# Patient Record
Sex: Female | Born: 1970
Health system: Southern US, Community
[De-identification: ages and names within clinical notes are randomized; demographics above are authoritative.]

---

## 2003-09-24 ENCOUNTER — Emergency Department (HOSPITAL_COMMUNITY): Admission: EM | Admit: 2003-09-24 | Discharge: 2003-09-24 | Payer: Self-pay | Admitting: Emergency Medicine

## 2014-06-11 ENCOUNTER — Encounter (HOSPITAL_COMMUNITY): Payer: Self-pay | Admitting: Emergency Medicine

## 2014-06-11 ENCOUNTER — Emergency Department (HOSPITAL_COMMUNITY)
Admission: EM | Admit: 2014-06-11 | Discharge: 2014-06-11 | Disposition: A | Discharge status: Home / Self Care | Payer: Self-pay | Attending: Emergency Medicine | Admitting: Emergency Medicine

## 2014-06-11 DIAGNOSIS — J069 Acute upper respiratory infection, unspecified: Secondary | ICD-10-CM | POA: Insufficient documentation

## 2014-06-11 DIAGNOSIS — Z792 Long term (current) use of antibiotics: Secondary | ICD-10-CM | POA: Insufficient documentation

## 2014-06-11 DIAGNOSIS — H6692 Otitis media, unspecified, left ear: Secondary | ICD-10-CM | POA: Insufficient documentation

## 2014-06-11 DIAGNOSIS — Z720 Tobacco use: Secondary | ICD-10-CM | POA: Insufficient documentation

## 2014-06-11 LAB — RAPID STREP SCREEN (MED CTR MEBANE ONLY): STREPTOCOCCUS, GROUP A SCREEN (DIRECT): NEGATIVE

## 2014-06-11 MED ORDER — AMOXICILLIN 500 MG PO CAPS: 500.0000 mg | ORAL_CAPSULE | Freq: Three times a day (TID) | ORAL | Status: DC

## 2014-06-11 MED ORDER — CARBAMIDE PEROXIDE 6.5 % OT SOLN
5.0000 [drp] | Freq: Two times a day (BID) | OTIC | Status: DC
  Filled 2014-06-11: qty 15

## 2014-06-11 MED ORDER — LIDOCAINE-EPINEPHRINE-TETRACAINE (LET) SOLUTION
3.0000 mL | Freq: Once | NASAL | Status: AC
  Administered 2014-06-11: 0.5 mL via TOPICAL
  Filled 2014-06-11: qty 3

## 2014-06-11 NOTE — ED Notes (Signed)
Strep screen NEGATIVE per Lab

## 2014-06-11 NOTE — ED Notes (Signed)
Pt reports pain and fullness in l/ear since early am. "feels pressure in ear when I burp"

## 2014-06-11 NOTE — Discharge Instructions (Signed)

## 2014-06-11 NOTE — ED Provider Notes (Signed)
CSN: [id number]     Arrival date & time 06/11/14  1242 History  This chart was scribed for non-physician practitioner Marlon Peliffany Rashawd Laskaris, PA-C working with Lyanne CoKevin M Campos, MD by Conchita ParisNadim Abuhashem, ED Scribe. This patient was seen in [facility] and the patient's care was started at 1:53 PM.   Chief Complaint  Patient presents with  . Otalgia    sharp pain in l/era since this am  . Sore Throat    c/o irriated throat with cough   Patient is a 43 y.o. female presenting with ear pain and pharyngitis. The history is provided by the patient. No language interpreter was used.  Otalgia Associated symptoms: sore throat   Sore Throat   HPI Comments: Belinda Casey is a 43 y.o. female who presents to the Emergency Department complaining of left ear pain and a sore throat, onset 1 day ago. She describes the pain as pressure in her left ear.  Pt also complains of generalized left sided body aches, onset 2 days ago but this subsided yesterday. Pt was able to walk. Pt had chills as associated symptoms. She had diarrhea but she believes this was due to her bath. She denies weakness.  She does smoke.  History reviewed. No pertinent past medical history. History reviewed. No pertinent past surgical history. Family History  Problem Relation Age of Onset  . Diabetes Mother   . Hypertension Mother   . Cancer Father    History  Substance Use Topics  . Smoking status: Current Every Day Smoker    Types: Cigarettes  . Smokeless tobacco: Not on file  . Alcohol Use: Yes   OB History    No data available     Review of Systems  HENT: Positive for ear pain and sore throat.   Musculoskeletal: Positive for myalgias.  All other systems reviewed and are negative.  Allergies  Review of patient's allergies indicates no known allergies.  Home Medications   Prior to Admission medications   Medication Sig Start Date End Date Taking? Authorizing Provider  Multiple Vitamin (MULTIVITAMIN WITH MINERALS) TABS  tablet Take 1 tablet by mouth daily.   Yes Historical Provider, MD  amoxicillin (AMOXIL) 500 MG capsule Take 1 capsule (500 mg total) by mouth 3 (three) times daily. 06/11/14   Cameryn Schum Irine SealG Euclid Cassetta, PA-C   BP 112/78 mmHg  Pulse 101  Temp(Src) 98.4 F (36.9 C) (Oral)  Wt 128 lb (58.06 kg)  SpO2 99%  LMP 05/28/2014 (Exact Date) Physical Exam  Constitutional: She is oriented to person, place, and time. She appears well-developed and well-nourished. No distress.  HENT:  Head: Normocephalic and atraumatic.  Right Ear: Tympanic membrane and ear canal normal.  Left Ear: There is swelling. Tympanic membrane is injected and erythematous.  Eyes: EOM are normal.  Neck: Normal range of motion. No spinous process tenderness and no muscular tenderness present.  Cardiovascular: Normal rate.   Pulmonary/Chest: Effort normal and breath sounds normal.  Neurological: She is alert and oriented to person, place, and time.  Skin: Skin is warm and dry.  Psychiatric: She has a normal mood and affect. Her behavior is normal.  Nursing note and vitals reviewed.   ED Course  Procedures  DIAGNOSTIC STUDIES: Oxygen Saturation is 99% on room air, normal by my interpretation.    COORDINATION OF CARE: 2:00 PM Discussed treatment plan with pt at bedside and pt agreed to plan.   Labs Review Labs Reviewed  RAPID STREP SCREEN   Imaging Review No results found.  EKG Interpretation None      MDM   Final diagnoses:  Left otitis media, recurrence not specified, unspecified chronicity, unspecified otitis media type  URI (upper respiratory infection)    Pt is well appearing.  43 y.o.Belinda Casey's evaluation in the Emergency Department is complete. It has been determined that no acute conditions requiring further emergency intervention are present at this time. The patient/guardian have been advised of the diagnosis and plan. We have discussed signs and symptoms that warrant return to the ED, such as  changes or worsening in symptoms.  Vital signs are stable at discharge. Filed Vitals:   06/11/14 1313  BP: 112/78  Pulse: 101  Temp: 98.4 F (36.9 C)    Patient/guardian has voiced understanding and agreed to follow-up with the PCP or specialist.  I personally performed the services described in this documentation, which was scribed in my presence. The recorded information has been reviewed and is accurate.    Dorthula Matasiffany G Raja Caputi, PA-C 06/11/14 1421  Lyanne CoKevin M Campos, MD 06/11/14 [id number]

## 2014-06-13 LAB — CULTURE, GROUP A STREP

## 2015-08-07 ENCOUNTER — Emergency Department (HOSPITAL_COMMUNITY)
Admission: EM | Admit: 2015-08-07 | Discharge: 2015-08-07 | Disposition: A | Discharge status: Home / Self Care | Payer: Self-pay | Attending: Emergency Medicine | Admitting: Emergency Medicine

## 2015-08-07 ENCOUNTER — Encounter (HOSPITAL_COMMUNITY): Payer: Self-pay | Admitting: Emergency Medicine

## 2015-08-07 DIAGNOSIS — Z792 Long term (current) use of antibiotics: Secondary | ICD-10-CM | POA: Insufficient documentation

## 2015-08-07 DIAGNOSIS — K0381 Cracked tooth: Secondary | ICD-10-CM | POA: Insufficient documentation

## 2015-08-07 DIAGNOSIS — K0889 Other specified disorders of teeth and supporting structures: Secondary | ICD-10-CM | POA: Insufficient documentation

## 2015-08-07 DIAGNOSIS — R200 Anesthesia of skin: Secondary | ICD-10-CM | POA: Insufficient documentation

## 2015-08-07 DIAGNOSIS — F17210 Nicotine dependence, cigarettes, uncomplicated: Secondary | ICD-10-CM | POA: Insufficient documentation

## 2015-08-07 DIAGNOSIS — R6883 Chills (without fever): Secondary | ICD-10-CM | POA: Insufficient documentation

## 2015-08-07 DIAGNOSIS — Z79899 Other long term (current) drug therapy: Secondary | ICD-10-CM | POA: Insufficient documentation

## 2015-08-07 MED ORDER — PENICILLIN V POTASSIUM 500 MG PO TABS: 500.0000 mg | ORAL_TABLET | Freq: Four times a day (QID) | ORAL | Status: AC

## 2015-08-07 MED ORDER — HYDROCODONE-ACETAMINOPHEN 5-325 MG PO TABS: 1.0000 | ORAL_TABLET | ORAL | Status: DC | PRN

## 2015-08-07 MED ORDER — IBUPROFEN 800 MG PO TABS: 800.0000 mg | ORAL_TABLET | Freq: Three times a day (TID) | ORAL | Status: DC | PRN

## 2015-08-07 MED ORDER — HYDROCODONE-ACETAMINOPHEN 5-325 MG PO TABS
1.0000 | ORAL_TABLET | Freq: Once | ORAL | Status: AC
  Administered 2015-08-07: 1 via ORAL
  Filled 2015-08-07: qty 1

## 2015-08-07 NOTE — ED Provider Notes (Signed)
CSN: [id number]     Arrival date & time 08/07/15  1809 History  By signing my name below, I, Linna Darner, attest that this documentation has been prepared under the direction and in the presence of non-physician practitioner, Trixie Dredge, PA-C. Electronically Signed: Linna Darner, Scribe. 08/07/2015. 6:36 PM.    Chief Complaint  Patient presents with  . Dental Pain    The history is provided by the patient. No language interpreter was used.     HPI Comments: Belinda Casey is a 45 y.o. female who presents to the Emergency Department complaining of sudden onset, constant, throbbing, right upper tooth pain beginning last night. She states that her tooth began feeling loose around a week ago; she tried to pull it out but it "broke." She reports that the area around the painful tooth feels swollen and numb. She endorses pain exacerbation with palpation to right upper tooth. Feels the area is swollen.  Pt states that she took a generic OTC pain medication with no relief. Pt reports experiencing moderate chills as well. Pt does not have a dentist. She denies fever, body aches, sore throat, difficulty swallowing, difficulty breathing, or any other associated symptoms at this time.    History reviewed. No pertinent past medical history. History reviewed. No pertinent past surgical history. Family History  Problem Relation Age of Onset  . Diabetes Mother   . Hypertension Mother   . Cancer Father    Social History  Substance Use Topics  . Smoking status: Current Some Day Smoker    Types: Cigarettes  . Smokeless tobacco: None  . Alcohol Use: Yes   OB History    No data available     Review of Systems  Constitutional: Negative for fever.  HENT: Positive for dental problem (right upper tooth) and facial swelling. Negative for sore throat and trouble swallowing.        Swelling around area of painful tooth  Respiratory: Negative for shortness of breath.   Musculoskeletal: Negative for  myalgias.  Skin: Negative for color change.  Allergic/Immunologic: Negative for immunocompromised state.  Neurological: Positive for numbness (right upper tooth).      Allergies  Review of patient's allergies indicates no known allergies.  Home Medications   Prior to Admission medications   Medication Sig Start Date End Date Taking? Authorizing Provider  amoxicillin (AMOXIL) 500 MG capsule Take 1 capsule (500 mg total) by mouth 3 (three) times daily. 06/11/14   Marlon Pel, PA-C  Multiple Vitamin (MULTIVITAMIN WITH MINERALS) TABS tablet Take 1 tablet by mouth daily.    Historical Provider, MD   BP 150/89 mmHg  Pulse 101  Temp(Src) 98 F (36.7 C) (Oral)  Resp 18  SpO2 98% Physical Exam  Constitutional: She appears well-developed and well-nourished. No distress.  HENT:  Head: Normocephalic and atraumatic.  Mouth/Throat: Uvula is midline and oropharynx is clear and moist. Mucous membranes are not dry. No uvula swelling. No oropharyngeal exudate, posterior oropharyngeal edema, posterior oropharyngeal erythema or tonsillar abscesses.  Deep fracture of the right upper canine with tenderness to percussion Adjacent facial tenderness.  Slight asymmetry of face with right side appearing slightly larger than left  Neck: Trachea normal, normal range of motion and phonation normal. Neck supple. No tracheal tenderness present. No rigidity. No tracheal deviation, no edema, no erythema and normal range of motion present.  Cardiovascular: Normal rate.   Pulmonary/Chest: Effort normal and breath sounds normal. No stridor.  Lymphadenopathy:    She has no cervical adenopathy.  Neurological: She is alert. She exhibits normal muscle tone.  Skin: She is not diaphoretic.  Nursing note and vitals reviewed.   ED Course  Procedures (including critical care time)  DIAGNOSTIC STUDIES: Oxygen Saturation is 98% on RA, normal by my interpretation.    COORDINATION OF CARE: 6:37 PM Will administer  Hydrocodone. Will discharge with Veetid 500 mg, ibuprofen 800 mg, and Hydrocodone. Discussed treatment plan with pt at bedside and pt agreed to plan.   Labs Review Labs Reviewed - No data to display  Imaging Review No results found. I have personally reviewed and evaluated these images and lab results as part of my medical decision-making.   EKG Interpretation None      MDM   Final diagnoses:  Pain, dental    Afebrile, nontoxic patient with new dental pain and possible abscess. No airway concerns.  Doubt Ludwig's angina.  D/C home with antibiotic, pain medication and dental follow up.  Discussed findings, treatment, and follow up  with patient.  Pt given return precautions.  Pt verbalizes understanding and agrees with plan.       I personally performed the services described in this documentation, which was scribed in my presence. The recorded information has been reviewed and is accurate.      Trixie Dredge, PA-C 08/07/15 1934  Mancel Bale, MD 08/08/15 [phone]

## 2015-08-07 NOTE — Discharge Instructions (Signed)
Read the information below.  Use the prescribed medication as directed.  Please discuss all new medications with your pharmacist.  Do not take additional tylenol while taking the prescribed pain medication to avoid overdose.  You may return to the Emergency Department at any time for worsening condition or any new symptoms that concern you.    Please call the dentist listed above within 48 hours to schedule a close follow up appointment.  If you develop fevers, swelling in your face, difficulty swallowing or breathing, return to the ER immediately for a recheck.     Emergency Department Resource Guide 1) Find a Doctor and Pay Out of Pocket Although you won't have to find out who is covered by your insurance plan, it is a good idea to ask around and get recommendations. You will then need to call the office and see if the doctor you have chosen will accept you as a new patient and what types of options they offer for patients who are self-pay. Some doctors offer discounts or will set up payment plans for their patients who do not have insurance, but you will need to ask so you aren't surprised when you get to your appointment.  2) Contact Your Local Health Department Not all health departments have doctors that can see patients for sick visits, but many do, so it is worth a call to see if yours does. If you don't know where your local health department is, you can check in your phone book. The CDC also has a tool to help you locate your state's health department, and many state websites also have listings of all of their local health departments.  3) Find a Walk-in Clinic If your illness is not likely to be very severe or complicated, you may want to try a walk in clinic. These are popping up all over the country in pharmacies, drugstores, and shopping centers. They're usually staffed by nurse practitioners or physician assistants that have been trained to treat common illnesses and complaints. They're  usually fairly quick and inexpensive. However, if you have serious medical issues or chronic medical problems, these are probably not your best option.  No Primary Care Doctor: - Call Health Connect at  [phone] - they can help you locate a primary care doctor that  accepts your insurance, provides certain services, etc. - Physician Referral Service- [phone]  Chronic Pain Problems: Organization         Address  Phone   Notes  [facility]  [phone] Patients need to be referred by their primary care doctor.   Medication Assistance: Organization         Address  Phone   Notes  [facility] Medication [facility] [address] [place] [place], [place] [address] [phone] --Must be a resident of [facility] -- Must have NO insurance coverage whatsoever (no Medicaid/ Medicare, etc.) -- The pt. MUST have a primary care doctor that directs their care regularly and follows them in the community   [facility]  [phone]   [employer]  [phone]    Agencies that provide inexpensive medical care: Organization         Address  Phone   Notes  [facility]  [phone]   [facility]    [phone]   [facility] [address] [place], [place] [address] [phone]   [facility] [address] [place]  [place] [phone]) [phone]   Planned Parenthood    [phone]   [facility]    [phone]   [facility]  [address], [place] Phone:  [phone], Fax:  [phone]) [phone] Hours of Operation:  9 am - 6 pm, M-F.  Also accepts Medicaid/Medicare and self-pay.  [facility]  [address], [place] Phone: [phone]) [phone], Fax: [phone]. Hours of Operation:  8:30 am - 5:30 pm, M-F.  Also accepts Medicaid and self-pay.  [facility] [place] [address], [place] Phone:  [phone]) [phone]   [facility] [address], [place], [place] [phone], Ext. [phone] Mondays & Thursdays: 7-9 AM.  First 15 patients are seen on a first come, first serve basis.    Medicaid-accepting [facility] Providers:  Organization         Address  Phone   Notes  [facility] [address], [place] [phone]) [phone] Also accepts self-pay patients.  [facility] [address], [place]  [phone]) [phone]   [facility] [address] [place] [address], [address], [place] [phone]) [phone]   [facility] [address] [place] [address], [place] [phone]) [phone]   Renaye RakersVeita Bland [address], [place]   [phone]) [phone] Only accepts [place] Access [place] patients after they have their name applied to their card.   Self-Pay (no insurance) in [facility]:  Organization         Address  Phone   Notes  Sickle Cell Patients, [facility] [address], [place] [phone]   [facility] [address], [place] [phone]   [facility] [place]  [address], [place] [phone]   [facility]/Dr. Osei-Bonsu  [address] [place] [address], [place] or [phone], [place] [phone] Phone number for both [place] and [place] locations is the same.  [facility] [address], [place] [phone]   [facility] [place] [address] [place] [address], [place] or [address] [phone]   [facility] [address], [place] [phone]) [phone], phone; [phone](336) [phone], fax Sees patients 1st and 3rd Saturday of every month.  Must not qualify for public or private insurance (i.e. Medicaid, Medicare, [place] Health Choice, Veterans' Benefits)  Household income should be no more than 200% of the poverty level The clinic cannot treat  you if you are pregnant or think you are pregnant  Sexually transmitted diseases are not treated at the clinic.    Dental Care: Organization         Address  Phone  Notes  [facility] of [facility] [address], [place] [phone] Accepts children up to age 321 who are enrolled in [place] or [place] Health Choice; pregnant women with a Medicaid card; and children who have applied for Medicaid or [place] Health Choice, but were declined, whose parents can pay a reduced fee at time of service.  [facility] [place]  [address], [place] [phone] Accepts children up to age 43 who are enrolled in [place] or [place] Health Choice; pregnant women with a Medicaid card; and children who have applied for Medicaid or [place]  Health Choice, but were declined, whose parents can pay a reduced fee at time of service.  [facility]  [address] [place], [place] [phone] Patients are seen by appointment only. Walk-ins are not accepted. [facility] will see patients 88 years of age and older. Monday - Tuesday (8am-5pm) Most Wednesdays (8:30-5pm) $30 per visit, cash only  [facility] Access [facility]  [address], [facility] [phone] Patients are seen by appointment only. Walk-ins are not accepted. [facility] will see patients 20 years of age and older. One Wednesday Evening (Monthly: Volunteer Based).  $30 per visit, cash only  [employer]  [phone] for adults; Children under age 64, call Graduate Pediatric Dentistry at [phone]. Children aged 45-14, please call [phone] to request a pediatric application.  Dental services are provided in all areas of dental care including fillings, crowns and bridges, complete and partial dentures, implants, gum treatment, root canals, and extractions. Preventive care is also provided. Treatment  is provided to both adults and children. Patients are selected via a lottery and there is often a waiting list.   [facility] [address] [place]  [phone] [email or web address]   [facility] [address] [place], [place] [phone], Ext. [phone] Second and Fourth Thursday of each month, opens at 6:30 AM; Clinic ends at 9 AM.  Patients are seen on a first-come first-served basis, and a limited number are seen during each clinic.   [facility]  [address] [place], [place] [phone]   Eligibility Requirements You must have lived in [place], [place], or [place] counties for at least the last three months.   You cannot be eligible for state or federal sponsored [place], including [employer], [place], or [employer].   You generally cannot be eligible for healthcare insurance through your employer.    How to apply: Eligibility screenings are held every Tuesday and Wednesday afternoon from 1:00 pm until 4:00 pm. You do not need an appointment for the interview!  [facility] [address], [place], [place] [phone]   [facility] Health Department  [phone]   [facility] Health Department  [phone]   [facility] Health Department  [phone]    Behavioral Health Resources in the Community: Intensive Outpatient Programs Organization         Address  Phone  Notes  [facility] [address], [place], [place] [phone]   [facility]patient [address], [place], [place] [phone]   [facility] [address] [place], [place]  [phone]   [facility] Mental Health [address],  [place], [place] [phone] or [phone]   Substance Abuse Resources Organization         Address  Phone  Notes  Al[facility] Drug Services  [phone]   [facility]  [phone]   The  [place]  [phone]   Floydene Flock  [phone]   Residential & Outpatient Substance Abuse Program  [phone]   Psychological Services Organization         Address  Phone  Notes  [facility] Behavioral Health  [phone]   [facility]  [phone]   [facility] [address] [place] [address], [place] [phone] or [phone]    Mobile Crisis Teams Organization         Address  Phone  Notes  [facility], Mobile Crisis Care Unit  [phone]   [facility]  [address] [place], [place] [phone]   [facility] [address] [place] [phone]    Self-Help/Support Groups Organization         Address  Phone             Notes  [facility]ental Health Assoc. of [place] - variety of support groups  336- [phone] Call for more information  Narcotics Anonymous (NA), C[facility] [address], [place]  2 meetings at this location   Statisticianesidential Treatment Programs Organization         Address  Phone  Notes  [facility] [address],    [place]  [phone]   [facility]  [address], [place] [phone], [place], [place] [phone]   [facility] [address], [place] [phone] Admissions: 8am-3pm M-F  [facility] [address]    [place], [place] [phone]   [facility] [address], [place], [place] [phone]   [facility] [address]  [place], [place] [phone]   [facility] - Intensive Outpatient [address], [place], [place] [phone]   [facility] ([facility] [address]  [place], [place] [phone] or [phone]   [facility] ([facility]) [address] [place], [place] [phone] Accepts Medicaid  [facility] [address]  [place] [phone] Substance Abuse/Addiction Treatment     [facility] Behavioral Health Resources Organization         Address  Phone  Notes  [facility]  [phone](888) [phone]   Angie FavaJulie Brannon, PhD [address], [address] [place], [place]   [phone]) [phone] or [phone](336) [phone]   [facility]   [address] [place], [place] [phone](336) [phone]   [facility] [address], [place], [place] [phone]) [phone] Insurance/Medicaid/sponsorship through [facility]  [facility]ith [facility] Families [address]                                    [place], [place] [phone]) [phone] Therapy/tele-psych/case  [facility] [address]   [place], [place] [phone]) [phone]    Dr. Lolly MustacheArfeen  [phone](336) [phone]   [facility] [place]  [employer] [facility] 1) [address], [place] 2) [address], [place] 3)  [address], [place] [phone]) [phone]  [phone](336) [phone]   [facility] Child Abuse Hotline [phone](336) [phone] or [phone](336) [phone] (After Hours)

## 2015-08-07 NOTE — ED Notes (Signed)
Pt from home for eval of dental pain to left upper tooth, states felt lose so pt tried to pull it but tooth broke. States does not have a dentist. No swelling or reddness ntoed. Pt tearful in triage.

## 2018-07-01 ENCOUNTER — Other Ambulatory Visit: Payer: Self-pay | Admitting: Family Medicine

## 2018-07-01 ENCOUNTER — Other Ambulatory Visit (HOSPITAL_COMMUNITY)
Admission: RE | Admit: 2018-07-01 | Discharge: 2018-07-01 | Disposition: A | Discharge status: Home / Self Care | Payer: 59 | Source: Ambulatory Visit | Attending: Family Medicine | Admitting: Family Medicine

## 2018-07-01 DIAGNOSIS — Z124 Encounter for screening for malignant neoplasm of cervix: Secondary | ICD-10-CM | POA: Diagnosis not present

## 2018-07-01 DIAGNOSIS — Z0000 Encounter for general adult medical examination without abnormal findings: Secondary | ICD-10-CM | POA: Diagnosis not present

## 2018-07-01 DIAGNOSIS — Z13220 Encounter for screening for lipoid disorders: Secondary | ICD-10-CM | POA: Diagnosis not present

## 2018-07-01 DIAGNOSIS — Z23 Encounter for immunization: Secondary | ICD-10-CM | POA: Diagnosis not present

## 2018-07-03 LAB — CYTOLOGY - PAP: Diagnosis: NEGATIVE

## 2019-01-17 ENCOUNTER — Telehealth: Payer: Self-pay

## 2019-01-17 DIAGNOSIS — Z20822 Contact with and (suspected) exposure to covid-19: Secondary | ICD-10-CM

## 2019-01-17 NOTE — Telephone Encounter (Signed)
Pt called wanting screening for covid. Pt given [place] location and order placed.

## 2019-01-18 ENCOUNTER — Other Ambulatory Visit: Payer: Self-pay

## 2019-01-18 DIAGNOSIS — Z20822 Contact with and (suspected) exposure to covid-19: Secondary | ICD-10-CM

## 2019-01-19 LAB — NOVEL CORONAVIRUS, NAA: SARS-CoV-2, NAA: NOT DETECTED

## 2019-07-23 ENCOUNTER — Ambulatory Visit (HOSPITAL_COMMUNITY)
Admission: EM | Admit: 2019-07-23 | Discharge: 2019-07-23 | Disposition: A | Discharge status: Home / Self Care | Payer: 59 | Attending: Family Medicine | Admitting: Family Medicine

## 2019-07-23 ENCOUNTER — Encounter (HOSPITAL_COMMUNITY): Payer: Self-pay | Admitting: Emergency Medicine

## 2019-07-23 ENCOUNTER — Other Ambulatory Visit: Payer: Self-pay

## 2019-07-23 DIAGNOSIS — S0003XA Contusion of scalp, initial encounter: Secondary | ICD-10-CM | POA: Diagnosis not present

## 2019-07-23 DIAGNOSIS — S0990XA Unspecified injury of head, initial encounter: Secondary | ICD-10-CM

## 2019-07-23 NOTE — Discharge Instructions (Addendum)
Take ibuprofen 600 - 800 mg for headache Take with food Rest Return as needed

## 2019-07-23 NOTE — ED Provider Notes (Signed)
[facility]    CSN: [id number] Arrival date & time: 07/23/19  1336      History   Chief Complaint Chief Complaint  Patient presents with  . Head Injury    HPI Belinda Casey is a 49 y.o. female.   HPI  Patient states that she was reaching up at the maximum overhead reach and pulled on a rack for hanging clothing.  A rack that was stacked on top of hers fell forward and struck her across the top of her head.  It knocked her to the ground.  She had immediate pain.  No loss of consciousness.  Injury was witnessed by coworker.  Since then she has had headache, photophobia, feeling tired. No change in thought or behavior.  No change in balance or dexterity.  No nausea or vomiting.  History reviewed. No pertinent past medical history.  There are no problems to display for this patient.   History reviewed. No pertinent surgical history.  OB History   No obstetric history on file.      Home Medications    Prior to Admission medications   Medication Sig Start Date End Date Taking? Authorizing Provider  ibuprofen (ADVIL,MOTRIN) 800 MG tablet Take 1 tablet (800 mg total) by mouth every 8 (eight) hours as needed for mild pain or moderate pain. 08/07/15   Trixie Dredge, PA-C  Multiple Vitamin (MULTIVITAMIN WITH MINERALS) TABS tablet Take 1 tablet by mouth daily.    [provider]    Family History Family History  Problem Relation Age of Onset  . Cancer Father   . Diabetes Mother   . Hypertension Mother     Social History Social History   Tobacco Use  . Smoking status: Current Some Day Smoker    Types: Cigarettes  Substance Use Topics  . Alcohol use: Yes  . Drug use: Not on file     Allergies   Patient has no known allergies.   Review of Systems Review of Systems  Constitutional: Positive for fatigue.  Eyes: Positive for photophobia. Negative for visual disturbance.  Musculoskeletal: Negative for gait problem.  Neurological: Positive for  headaches. Negative for weakness and numbness.  Psychiatric/Behavioral: Negative for behavioral problems, confusion and decreased concentration.     Physical Exam Triage Vital Signs ED Triage Vitals  Enc Vitals Group     BP 07/23/19 1449 (!) 140/104     Pulse Rate 07/23/19 1449 90     Resp 07/23/19 1449 18     Temp 07/23/19 1449 98.7 F (37.1 C)     Temp src --      SpO2 07/23/19 1449 99 %     Weight --      Height --      Head Circumference --      Peak Flow --      Pain Score 07/23/19 1451 5     Pain Loc --      Pain Edu? --      Excl. in GC? --    No data found.  Updated Vital Signs BP (!) 140/104   Pulse 90   Temp 98.7 F (37.1 C)   Resp 18   LMP 07/07/2019   SpO2 99%   Visual Acuity Right Eye Distance:   Left Eye Distance:   Bilateral Distance:    Right Eye Near:   Left Eye Near:    Bilateral Near:     Physical Exam Constitutional:      General: She is  not in acute distress.    Appearance: Normal appearance. She is well-developed.  HENT:     Head: Normocephalic and atraumatic.     Nose: Nose normal.     Mouth/Throat:     Mouth: Mucous membranes are moist.     Pharynx: No posterior oropharyngeal erythema.  Eyes:     Extraocular Movements: Extraocular movements intact.     Conjunctiva/sclera: Conjunctivae normal.     Pupils: Pupils are equal, round, and reactive to light.     Comments: Pupils equal, discs flat  Cardiovascular:     Rate and Rhythm: Normal rate.  Pulmonary:     Effort: Pulmonary effort is normal. No respiratory distress.  Musculoskeletal:        General: Normal range of motion.     Cervical back: Normal range of motion and neck supple. No tenderness.  Lymphadenopathy:     Cervical: No cervical adenopathy.  Skin:    General: Skin is warm and dry.     Comments: See picture.  No palpable defect  Neurological:     General: No focal deficit present.     Mental Status: She is alert.     Motor: No weakness.     Coordination:  Coordination normal.     Gait: Gait normal.     Deep Tendon Reflexes: Reflexes normal.  Psychiatric:        Mood and Affect: Mood normal.        Behavior: Behavior normal.        UC Treatments / Results  Labs (all labs ordered are listed, but only abnormal results are displayed) Labs Reviewed - No data to display  EKG   Radiology No results found.  Procedures Procedures (including critical care time)  Medications Ordered in UC Medications - No data to display  Initial Impression / Assessment and Plan / UC Course  I have reviewed the triage vital signs and the nursing notes.  Pertinent labs & imaging results that were available during my care of the patient were reviewed by me and considered in my medical decision making (see chart for details).     Head contusion with mild concussion Final Clinical Impressions(s) / UC Diagnoses   Final diagnoses:  Contusion of scalp, initial encounter  Closed head injury, initial encounter     Discharge Instructions     Take ibuprofen 600 - 800 mg for headache Take with food Rest Return as needed   ED Prescriptions    None     PDMP not reviewed this encounter.   Raylene Everts, MD 07/23/19 [phone]

## 2019-07-23 NOTE — ED Triage Notes (Signed)
Pt states today a metal fixture fell from the ceiling, around 5-7 pounds, hit her right on the top of the head. Denies LOC. Mild headache.

## 2020-02-11 ENCOUNTER — Encounter (HOSPITAL_COMMUNITY): Payer: Self-pay

## 2020-02-11 ENCOUNTER — Ambulatory Visit (HOSPITAL_COMMUNITY)
Admission: EM | Admit: 2020-02-11 | Discharge: 2020-02-11 | Disposition: A | Discharge status: Home / Self Care | Payer: 59 | Attending: Physician Assistant | Admitting: Physician Assistant

## 2020-02-11 ENCOUNTER — Other Ambulatory Visit: Payer: Self-pay

## 2020-02-11 DIAGNOSIS — M5431 Sciatica, right side: Secondary | ICD-10-CM | POA: Diagnosis not present

## 2020-02-11 MED ORDER — KETOROLAC TROMETHAMINE 30 MG/ML IJ SOLN
30.0000 mg | Freq: Once | INTRAMUSCULAR | Status: AC
  Administered 2020-02-11: 30 mg via INTRAMUSCULAR

## 2020-02-11 MED ORDER — ACETAMINOPHEN 500 MG PO TABS: 1000.0000 mg | ORAL_TABLET | Freq: Three times a day (TID) | ORAL | 0 refills | Status: DC | PRN

## 2020-02-11 MED ORDER — KETOROLAC TROMETHAMINE 30 MG/ML IJ SOLN
INTRAMUSCULAR | Status: AC
  Filled 2020-02-11: qty 1

## 2020-02-11 MED ORDER — NAPROXEN 500 MG PO TABS: 500.0000 mg | ORAL_TABLET | Freq: Two times a day (BID) | ORAL | 0 refills | Status: DC

## 2020-02-11 MED ORDER — TIZANIDINE HCL 4 MG PO TABS: 4.0000 mg | ORAL_TABLET | Freq: Every day | ORAL | 0 refills | Status: AC

## 2020-02-11 NOTE — Discharge Instructions (Signed)
Take medication as prescribed - naproxen 2 times a day with food.  - tylenol every 8 hours - muscle relaxer only at night, this will make you sleepy, do not drive, drink alcohol or operate machinery within 8 hours   Follow up with sports medicine if not improving over next few days

## 2020-02-11 NOTE — ED Triage Notes (Signed)
Pt present right leg and hip pain, symptoms started on Thursday. Pt is having difficulty walking and if she sits to long it gets stuff and worst.

## 2020-02-11 NOTE — ED Provider Notes (Signed)
[facility]    CSN: [id number] Arrival date & time: 02/11/20  1737      History   Chief Complaint Chief Complaint  Patient presents with  . Leg Pain    right     HPI Belinda Casey is a 49 y.o. female.   Patient reports for evaluation of right hip and leg pain.  She reports symptoms started a few days ago.  She reports pain starting in the right hip radiating down her right leg.  She reports an aching and a shooting pain.  She reports pain is worse after being seated for a while and gets very stiff.  Denies any weakness or numbness.  Denies any change in bowels or bladder.  She does report she had a little bit of low back pain before the symptoms started.  Denies any trauma, or lifting heavy objects.  No previous injuries.  No previous issues.  No rashes.     History reviewed. No pertinent past medical history.  There are no problems to display for this patient.   History reviewed. No pertinent surgical history.  OB History   No obstetric history on file.      Home Medications    Prior to Admission medications   Medication Sig Start Date End Date Taking? Authorizing Provider  acetaminophen (TYLENOL) 500 MG tablet Take 2 tablets (1,000 mg total) by mouth every 8 (eight) hours as needed. 02/11/20   Yarisbel Miranda, Veryl Speak, PA-C  ibuprofen (ADVIL,MOTRIN) 800 MG tablet Take 1 tablet (800 mg total) by mouth every 8 (eight) hours as needed for mild pain or moderate pain. 08/07/15   Trixie Dredge, PA-C  Multiple Vitamin (MULTIVITAMIN WITH MINERALS) TABS tablet Take 1 tablet by mouth daily.    [provider]  naproxen (NAPROSYN) 500 MG tablet Take 1 tablet (500 mg total) by mouth 2 (two) times daily. 02/11/20   Karlis Cregg, Veryl Speak, PA-C  tiZANidine (ZANAFLEX) 4 MG tablet Take 1 tablet (4 mg total) by mouth at bedtime for 14 days. 02/11/20 02/25/20  Tyja Gortney, Veryl Speak, PA-C    Family History Family History  Problem Relation Age of Onset  . Cancer Father   . Diabetes Mother   .  Hypertension Mother     Social History Social History   Tobacco Use  . Smoking status: Current Some Day Smoker    Types: Cigarettes  Substance Use Topics  . Alcohol use: Yes  . Drug use: Not on file     Allergies   Patient has no known allergies.   Review of Systems Review of Systems   Physical Exam Triage Vital Signs ED Triage Vitals  Enc Vitals Group     BP 02/11/20 1914 (!) 154/104     Pulse Rate 02/11/20 1914 81     Resp 02/11/20 1914 18     Temp 02/11/20 1914 98.2 F (36.8 C)     Temp Source 02/11/20 1914 Oral     SpO2 02/11/20 1914 100 %     Weight --      Height --      Head Circumference --      Peak Flow --      Pain Score 02/11/20 1915 10     Pain Loc --      Pain Edu? --      Excl. in GC? --    No data found.  Updated Vital Signs BP (!) 154/104 (BP Location: Right Arm)   Pulse 81   Temp  98.2 F (36.8 C) (Oral)   Resp 18   SpO2 100%   Visual Acuity Right Eye Distance:   Left Eye Distance:   Bilateral Distance:    Right Eye Near:   Left Eye Near:    Bilateral Near:     Physical Exam Vitals and nursing note reviewed.  Constitutional:      General: She is not in acute distress.    Appearance: She is well-developed. She is not ill-appearing.  HENT:     Head: Normocephalic and atraumatic.  Cardiovascular:     Rate and Rhythm: Normal rate.     Heart sounds: No murmur heard.   Pulmonary:     Effort: Pulmonary effort is normal. No respiratory distress.  Musculoskeletal:     Comments: Mild tenderness to palpation of the right lower lumbar, with extension of tenderness into the right upper gluteal musculature through the lateral glutes.  There is some tenderness to palpation over the greater trochanter.  Minimal tenderness of the right thigh musculature or hamstrings musculature.  Patient does have some pain with terminal hip flexion, no significant pain with FABER or FADIR.  Straight leg raise is positive.  She is ambulatory.  Strength is  5/5 in lower extremities equal bilaterally.  Sensation is intact.  Distal pulses 2+.  Skin:    General: Skin is warm and dry.  Neurological:     Mental Status: She is alert.      UC Treatments / Results  Labs (all labs ordered are listed, but only abnormal results are displayed) Labs Reviewed - No data to display  EKG   Radiology No results found.  Procedures Procedures (including critical care time)  Medications Ordered in UC Medications  ketorolac (TORADOL) 30 MG/ML injection 30 mg (30 mg Intramuscular Given 02/11/20 2009)    Initial Impression / Assessment and Plan / UC Course  I have reviewed the triage vital signs and the nursing notes.  Pertinent labs & imaging results that were available during my care of the patient were reviewed by me and considered in my medical decision making (see chart for details).     #Sciatica right side Patient is a 49 year old presenting with appears to be sciatica of the right side.  No red flags.  No indication for imaging.  Will give Toradol injection here and naproxen outpatient.  Muscle relaxer prescribed.  We will have her follow-up with sports medicine.  Patient verbalized understanding plan of care. Final Clinical Impressions(s) / UC Diagnoses   Final diagnoses:  Sciatica of right side     Discharge Instructions     Take medication as prescribed - naproxen 2 times a day with food.  - tylenol every 8 hours - muscle relaxer only at night, this will make you sleepy, do not drive, drink alcohol or operate machinery within 8 hours   Follow up with sports medicine if not improving over next few days      ED Prescriptions    Medication Sig Dispense Auth. Provider   naproxen (NAPROSYN) 500 MG tablet Take 1 tablet (500 mg total) by mouth 2 (two) times daily. 30 tablet Amdrew Oboyle, Veryl Speak, PA-C   tiZANidine (ZANAFLEX) 4 MG tablet Take 1 tablet (4 mg total) by mouth at bedtime for 14 days. 14 tablet Arlisa Leclere, Veryl Speak, PA-C    acetaminophen (TYLENOL) 500 MG tablet Take 2 tablets (1,000 mg total) by mouth every 8 (eight) hours as needed. 30 tablet Paizley Ramella, Veryl Speak, PA-C     PDMP  not reviewed this encounter.   Hermelinda Medicus, PA-C 02/11/20 2122

## 2020-02-18 ENCOUNTER — Ambulatory Visit
Admission: RE | Admit: 2020-02-18 | Discharge: 2020-02-18 | Disposition: A | Discharge status: Home / Self Care | Payer: 59 | Source: Ambulatory Visit | Attending: Family Medicine | Admitting: Family Medicine

## 2020-02-18 ENCOUNTER — Ambulatory Visit (INDEPENDENT_AMBULATORY_CARE_PROVIDER_SITE_OTHER): Payer: 59 | Admitting: Family Medicine

## 2020-02-18 ENCOUNTER — Other Ambulatory Visit: Payer: Self-pay

## 2020-02-18 VITALS — BP 122/82 | Ht 61.0 in

## 2020-02-18 DIAGNOSIS — M5441 Lumbago with sciatica, right side: Secondary | ICD-10-CM

## 2020-02-18 DIAGNOSIS — R29898 Other symptoms and signs involving the musculoskeletal system: Secondary | ICD-10-CM

## 2020-02-18 MED ORDER — GABAPENTIN 100 MG PO CAPS: ORAL_CAPSULE | ORAL | 0 refills | Status: DC

## 2020-02-18 MED ORDER — PREDNISONE 10 MG PO TABS: ORAL_TABLET | ORAL | 0 refills | Status: DC

## 2020-02-18 NOTE — Progress Notes (Signed)
  Belinda Casey - 49 y.o. female MRN [id number]  Date of birth: [birth year]    SUBJECTIVE:      Chief Complaint:/ HPI:   1 week of right lower extremity wekaness. Started acutely, noted when sho got out of bed about 7 days ago. Initially had some low back pain but that has improved. Right leg feels like it will not hold her p. Feels numb in anterior thigh area and having shooting pains down inside and into groin. Having d3fficulty at work as she stands most of the day and lifts boxes. No prior issues with back No back surgery No systemic symptoms. Pain is improved in back but weakness in leg seems to be gtting worse.    OBJECTIVE: BP 122/82   Ht 5\' 1"  (1.549 m)   BMI 24.19 kg/m   Physical Exam:  Vital signs are reviewed. GEN WD WN NAD BACK nontender, no spasm, no defect HIPS IR/ER full and painless MSK: LE strength normal on left side but on  Right, hip extension, knee extension and flexion, dorsiflexion and plantar flexion all 4/5.  NEURO: DTRs 2+ bilaterally equal knees , 1+ B ankle. Decreased sensation to soft touch anterior right thigh, EXT: No lower extremity edema VASC: DP 2+ B= Gait is antalgic   ASSESSMENT & PLAN:  1. Concern for progressive myelopathy right lower extreimty. Most likely from HNP although back pain is minimal. Cannot rule out CNS disease. Will start with imaging of LS spine. Work note. She wants to try and go back in a few days, at least part time, as finances are tight.Does not think she needs any pain medicine, but would be willing to try some  Gabapentin to help lancinating intermittent pains which bother her when standing. Discussed red flags. F/u next week

## 2020-02-18 NOTE — Patient Instructions (Signed)
Am starting you on prednisone as an anti-inflammatory.  Take 5 tablets a day for the next 5 days.  Take with food.  I am also starting you on a medicine called gabapentin.  We will start low and go slowly with a taper up.  Start 1 tablet at night for 3 days and then increase to 2 tablets at night.  I will set you up with follow-up next week.  We will write you out of work until follow-up next week.  I suspect you have a ruptured disc in your back.  You have significant weakness of your right lower extremity.  If this should worsen or if you have any changes in urinary or bowel continence, you need to let us know or go to the emergency room as soon as possible.  Please call us with any new or worsening symptoms.

## 2020-02-19 ENCOUNTER — Encounter: Payer: Self-pay | Admitting: Family Medicine

## 2020-02-23 ENCOUNTER — Ambulatory Visit (INDEPENDENT_AMBULATORY_CARE_PROVIDER_SITE_OTHER): Payer: 59 | Admitting: Family Medicine

## 2020-02-23 ENCOUNTER — Other Ambulatory Visit: Payer: Self-pay

## 2020-02-23 ENCOUNTER — Encounter: Payer: Self-pay | Admitting: Family Medicine

## 2020-02-23 VITALS — BP 114/82 | Ht 61.0 in

## 2020-02-23 DIAGNOSIS — M5416 Radiculopathy, lumbar region: Secondary | ICD-10-CM

## 2020-02-23 MED ORDER — HYDROCODONE-ACETAMINOPHEN 5-325 MG PO TABS: 1.0000 | ORAL_TABLET | Freq: Four times a day (QID) | ORAL | 0 refills | Status: DC | PRN

## 2020-02-23 MED ORDER — CYCLOBENZAPRINE HCL 10 MG PO TABS: 10.0000 mg | ORAL_TABLET | Freq: Every evening | ORAL | 0 refills | Status: DC | PRN

## 2020-02-23 MED ORDER — MELOXICAM 15 MG PO TABS: 15.0000 mg | ORAL_TABLET | Freq: Every day | ORAL | 2 refills | Status: DC

## 2020-02-23 NOTE — Progress Notes (Addendum)
   PCP: System, Pcp Not In  Subjective:   HPI: Patient is a 49 y.o. female here for follow-up on lumbar radiculopathy.  She was seen on 02/18/2020 by Dr. Jennette Kettle, at that time was having acute lower extremity pain and weakness.  It started when she got out of bed, she not have any trauma or significant inciting factors prior to the onset of her pain.  She does however stand for most of the day and lift boxes in her job at [employer].  She denies any prior back pain, surgery.  No fevers or chills.  At last visit, patient was prescribed a prednisone burst and gabapentin 100 mg at bedtime for the pain.  X-rays were obtained which showed L4-L5 lumbar spondylosis which is consistent with patient's distribution of pain.  MRI was ordered which is pending.  Since last visit, patient reports that the prednisone seem helpful for 1 day but did not help beyond that.  She reports that she feels basically the same as she did at previous visit.  She feels the gabapentin is somewhat helpful.  She has not been able to complete her activities at work and is wondering if there is any other medication she can take in the meantime to help with her pain.  She continues to deny any saddle anesthesia, significant lower extremity weakness, bowel/bladder incontinence, or any other cauda equina syndrome symptoms.  Review of Systems:  Per HPI.   PMFSH, medications and smoking status reviewed.      Objective:  Physical Exam:  Gen: awake, alert, NAD, comfortable in exam room Pulm: breathing unlabored  Lumbar spine:  Inspection: No evidence of erythema, ecchymosis, swelling edema.  Palpation: No midline spinal tenderness. Nontender to facets.  Mild right paraspinal tenderness. Nontender to SI joints.  ROM: Intact to forward flexion, extension, rotation, and bending.  She does have some pain with forward flexion. Special tests: Positive straight leg raise on the right with reproduction of symptoms on the right leg.  Negative on  left. Neurovascularly intact distally.    Assessment & Plan:  1.  L4-5 lumbar radiculopathy  Patient with continued significant neuropathic symptoms and radiculopathy consistent with x-ray findings.  MRI has been ordered by Dr. Jennette Kettle, plan to follow this up for determination of next steps.  We discussed the possibility of spinal injections versus PT versus possibly surgery depending on what the results show.  In the meantime, we will start meloxicam daily, increase her gabapentin to 200 mg at night and 100 mg in the morning, and give a small course of Norco for breakthrough pain.  She knows to follow-up if she develops worsening symptoms or any cauda equina symptoms.   Guy Sandifer, MD [facility] Fellow 02/23/2020 2:38 PM  Addendum:  Patient seen in the office by fellow.  His history, exam, plan of care were precepted with me.  Norton Blizzard MD Marrianne Mood

## 2020-03-06 ENCOUNTER — Telehealth: Payer: Self-pay | Admitting: Family Medicine

## 2020-03-06 NOTE — Telephone Encounter (Signed)
Patient is calling stating she recently got her MRI and is needing Dr. Jennette Kettle to call her with results and then next step that's being taking, patient says at work she is only able to work 4 to 5 hours with being and horrible pain. Thanks

## 2020-03-07 ENCOUNTER — Encounter: Payer: Self-pay | Admitting: *Deleted

## 2020-03-07 NOTE — Telephone Encounter (Signed)
I found it through Care Everywhere. She needs a back Careers adviser. Please see who she wants to be scheduled with. I can give her a note out of work if she wants and I can give her some pain meds until she sees Careers adviser. Denny Levy

## 2020-03-07 NOTE — Telephone Encounter (Signed)
Can you get her MRI results for me? THANKS! Denny Levy

## 2020-03-13 ENCOUNTER — Encounter: Payer: Self-pay | Admitting: *Deleted

## 2020-03-13 NOTE — Progress Notes (Signed)
Dr Coletta Memos [facility] [address] [place] [address] 03/14/20 @ 1p [phone]

## 2021-05-03 ENCOUNTER — Emergency Department (HOSPITAL_COMMUNITY)
Admission: EM | Admit: 2021-05-03 | Discharge: 2021-05-03 | Discharge status: Against Medical Advice | Payer: 59 | Attending: Emergency Medicine | Admitting: Emergency Medicine

## 2021-05-03 NOTE — ED Notes (Signed)
T didn't answer when called for triage

## 2021-05-03 NOTE — ED Provider Notes (Cosign Needed)
Pt eloped prior to being triaged. I did not establish care with her.   Karrie Meres, [place] 05/03/21 2057

## 2021-05-04 ENCOUNTER — Encounter (HOSPITAL_COMMUNITY): Payer: Self-pay

## 2021-05-04 ENCOUNTER — Other Ambulatory Visit: Payer: Self-pay

## 2021-05-04 ENCOUNTER — Ambulatory Visit (HOSPITAL_COMMUNITY)
Admission: EM | Admit: 2021-05-04 | Discharge: 2021-05-04 | Disposition: A | Discharge status: Home / Self Care | Payer: 59

## 2021-05-04 DIAGNOSIS — J069 Acute upper respiratory infection, unspecified: Secondary | ICD-10-CM | POA: Diagnosis not present

## 2021-05-04 DIAGNOSIS — R0981 Nasal congestion: Secondary | ICD-10-CM

## 2021-05-04 MED ORDER — BENZONATATE 100 MG PO CAPS: 100.0000 mg | ORAL_CAPSULE | Freq: Three times a day (TID) | ORAL | 0 refills | Status: DC

## 2021-05-04 MED ORDER — ONDANSETRON 4 MG PO TBDP: 4.0000 mg | ORAL_TABLET | Freq: Three times a day (TID) | ORAL | 0 refills | Status: DC | PRN

## 2021-05-04 MED ORDER — FLUTICASONE PROPIONATE 50 MCG/ACT NA SUSP: 2.0000 | Freq: Every day | NASAL | 0 refills | Status: DC

## 2021-05-04 NOTE — ED Triage Notes (Signed)
Pt c/o rt ear pain since Monday, currently on antibiotics for ear infection. States now having diarrhea, vomiting, and dizziness since starting the antibiotics. Pt states still having cough and congestion.

## 2021-05-04 NOTE — ED Provider Notes (Addendum)
[facility]    CSN: [id number] Arrival date & time: 05/04/21  5852      History   Chief Complaint Chief Complaint  Patient presents with   Otalgia    HPI Belinda Casey is a 50 y.o. female.   HPI  Otalgia: Pt reports that she has had right ear pain since Monday. She also reports cough and congestion. She was started on Augmentin for an ear infection but reports that this medication has caused her diarrhea, vomiting and some occasional dizziness. NO abdominal pain, dysuria. She has completed 5 days of this medication. She has been able to rest, hydrate and keep down fluids/food. No bloody movements or vomiting.   History reviewed. No pertinent past medical history.  There are no problems to display for this patient.   History reviewed. No pertinent surgical history.  OB History   No obstetric history on file.      Home Medications    Prior to Admission medications   Medication Sig Start Date End Date Taking? Authorizing Provider  benzonatate (TESSALON) 100 MG capsule Take 1 capsule (100 mg total) by mouth every 8 (eight) hours. 05/04/21  Yes Kalany Diekmann M, PA-C  fluticasone [facility]) 50 MCG/ACT nasal spray Place 2 sprays into both nostrils daily. 05/04/21  Yes Zara Wendt, Maralyn Sago M, PA-C  ondansetron (ZOFRAN ODT) 4 MG disintegrating tablet Take 1 tablet (4 mg total) by mouth every 8 (eight) hours as needed for nausea or vomiting. 05/04/21  Yes Deshunda Thackston M, PA-C  ibuprofen (ADVIL,MOTRIN) 800 MG tablet Take 1 tablet (800 mg total) by mouth every 8 (eight) hours as needed for mild pain or moderate pain. 08/07/15   Trixie Dredge, PA-C  Multiple Vitamin (MULTIVITAMIN WITH MINERALS) TABS tablet Take 1 tablet by mouth daily.    [provider]    Family History Family History  Problem Relation Age of Onset   Cancer Father    Diabetes Mother    Hypertension Mother     Social History Social History   Tobacco Use   Smoking status: Some Days     Types: Cigarettes  Substance Use Topics   Alcohol use: Yes     Allergies   Patient has no known allergies.   Review of Systems Review of Systems  As stated above in HPI Physical Exam Triage Vital Signs ED Triage Vitals  Enc Vitals Group     BP 05/04/21 0856 (!) 147/115     Pulse Rate 05/04/21 0856 (!) 101     Resp 05/04/21 0856 18     Temp 05/04/21 0856 99.3 F (37.4 C)     Temp Source 05/04/21 0856 Oral     SpO2 05/04/21 0856 98 %     Weight --      Height --      Head Circumference --      Peak Flow --      Pain Score 05/04/21 0857 6     Pain Loc --      Pain Edu? --      Excl. in GC? --    No data found.  Updated Vital Signs BP (!) 147/115 (BP Location: Left Arm)   Pulse (!) 101   Temp 99.3 F (37.4 C) (Oral)   Resp 18   LMP 04/27/2021   SpO2 98%   Physical Exam Vitals and nursing note reviewed.  Constitutional:      General: She is not in acute distress.    Appearance: Normal appearance. She  is not ill-appearing, toxic-appearing or diaphoretic.  HENT:     Head: Normocephalic and atraumatic.     Right Ear: Tympanic membrane normal.     Left Ear: Tympanic membrane normal.     Ears:     Comments: Clear middle ear effusion bilaterally     Nose: Congestion (No sinus bogginess) and rhinorrhea present.     Mouth/Throat:     Mouth: Mucous membranes are moist.     Pharynx: Oropharynx is clear. No oropharyngeal exudate or posterior oropharyngeal erythema.  Eyes:     General:        Right eye: No discharge.        Left eye: No discharge.     Extraocular Movements: Extraocular movements intact.     Conjunctiva/sclera: Conjunctivae normal.     Pupils: Pupils are equal, round, and reactive to light.  Cardiovascular:     Rate and Rhythm: Normal rate and regular rhythm.     Heart sounds: Normal heart sounds.  Pulmonary:     Effort: Pulmonary effort is normal. No respiratory distress.     Breath sounds: Normal breath sounds. No stridor. No wheezing or  rhonchi.  Abdominal:     General: Bowel sounds are normal.     Palpations: Abdomen is soft.     Tenderness: There is no abdominal tenderness. There is no guarding.  Musculoskeletal:     Cervical back: Normal range of motion and neck supple.  Lymphadenopathy:     Cervical: No cervical adenopathy.  Skin:    General: Skin is warm.     Coloration: Skin is not jaundiced.     Findings: No erythema or rash.  Neurological:     Mental Status: She is alert and oriented to person, place, and time.     UC Treatments / Results  Labs (all labs ordered are listed, but only abnormal results are displayed) Labs Reviewed - No data to display  EKG   Radiology No results found.  Procedures Procedures (including critical care time)  Medications Ordered in UC Medications - No data to display  Initial Impression / Assessment and Plan / UC Course  I have reviewed the triage vital signs and the nursing notes.  Pertinent labs & imaging results that were available during my care of the patient were reviewed by me and considered in my medical decision making (see chart for details).     New.  I discussed with patient that she likely has a viral illness and that the antibiotic would not help with the cough and congestion.  She appears to have fairly standard side effects from the Augmentin antibiotic so we are going to stop this medication at this time.  As she has completed over 5 days of this antibiotic and her ear infection appears to be cleared I would not recommend that she take additional antibiotics.  Bland diet, Zofran, rest and hydration encouraged.  I am going to send in [place] and Flonase to help with her symptoms as well. Final Clinical Impressions(s) / UC Diagnoses   Final diagnoses:  Viral URI with cough  Nasal congestion   Discharge Instructions   None    ED Prescriptions     Medication Sig Dispense Auth. Provider   benzonatate (TESSALON) 100 MG capsule Take 1 capsule (100  mg total) by mouth every 8 (eight) hours. 21 capsule Merikay Lesniewski M, PA-C   fluticasone [facility]) 50 MCG/ACT nasal spray Place 2 sprays into both nostrils daily. 16 mL Clent Jacks  M, PA-C   ondansetron (ZOFRAN ODT) 4 MG disintegrating tablet Take 1 tablet (4 mg total) by mouth every 8 (eight) hours as needed for nausea or vomiting. 20 tablet Rushie Chestnut, [place]      PDMP not reviewed this encounter.   Rushie Chestnut, PA-C 05/04/21 0922    Rushie Chestnut, PA-C 05/04/21 [phone]

## 2021-10-11 IMAGING — CR DG LUMBAR SPINE 2-3V
3 series · 3 of 3 positions shown · non-contrast
Comparison: No pertinent prior exams are available for comparison.

CLINICAL DATA: Weakness of right lower extremity. Acute right-sided
low back pain with right-sided sciatica. Lower extremity weakness
with back pain. Additional history provided by technologist: Patient
reports pain to lower back and right hip area with weakness down
right leg. Patient reports pain began on [REDACTED].

EXAM:
LUMBAR SPINE - 2-3 VIEW

[t l-spine a.p.]
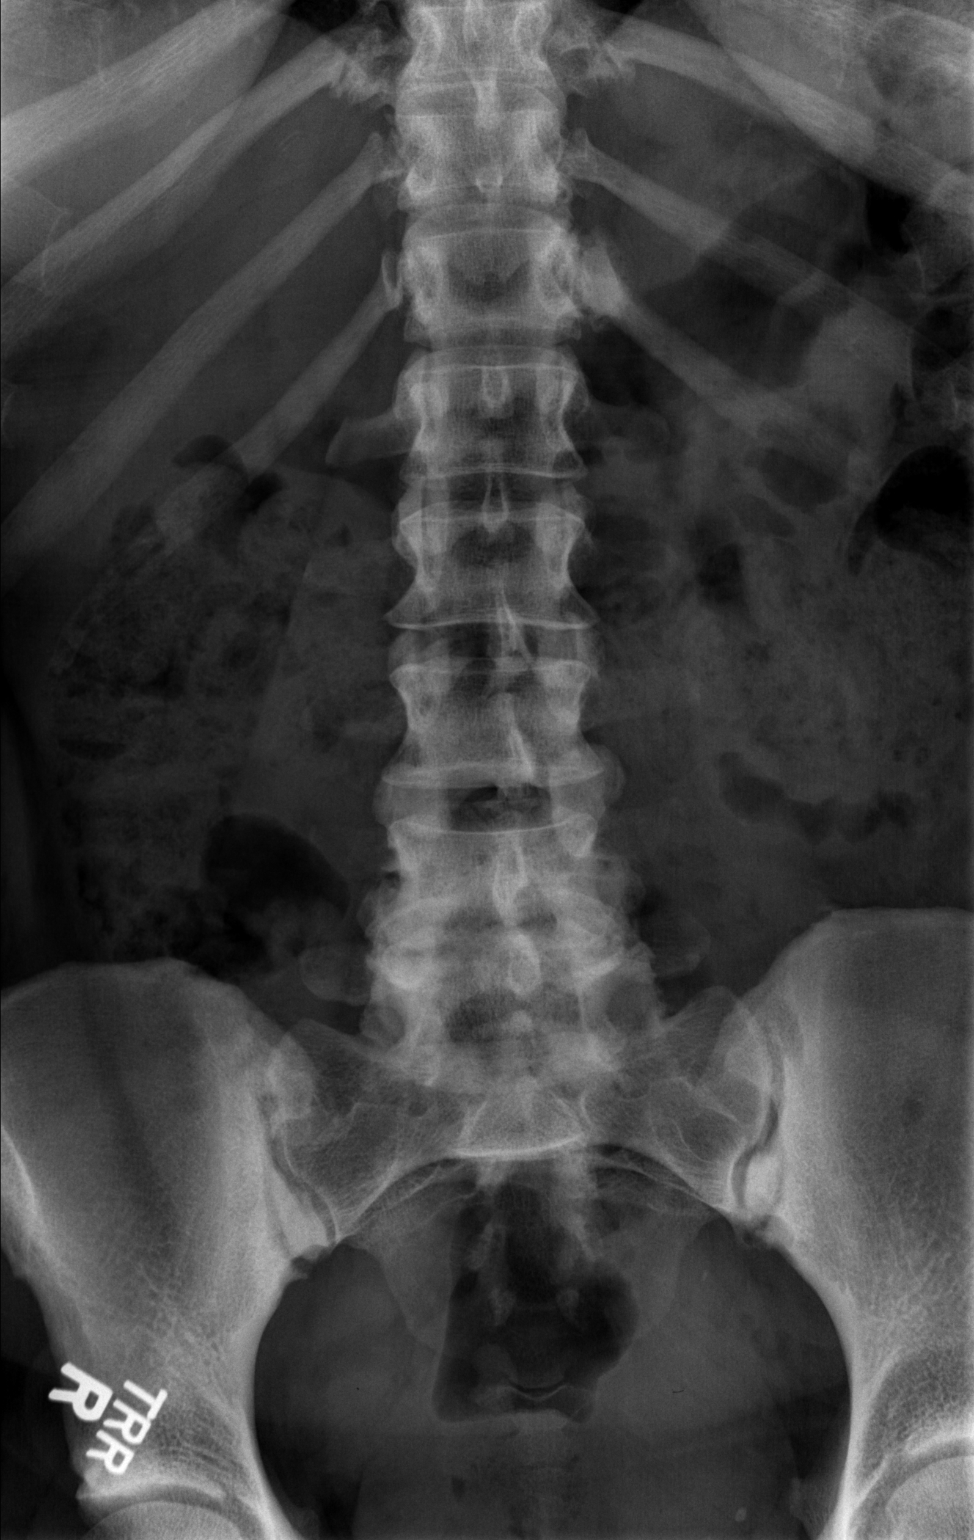

[t l-spine lat]
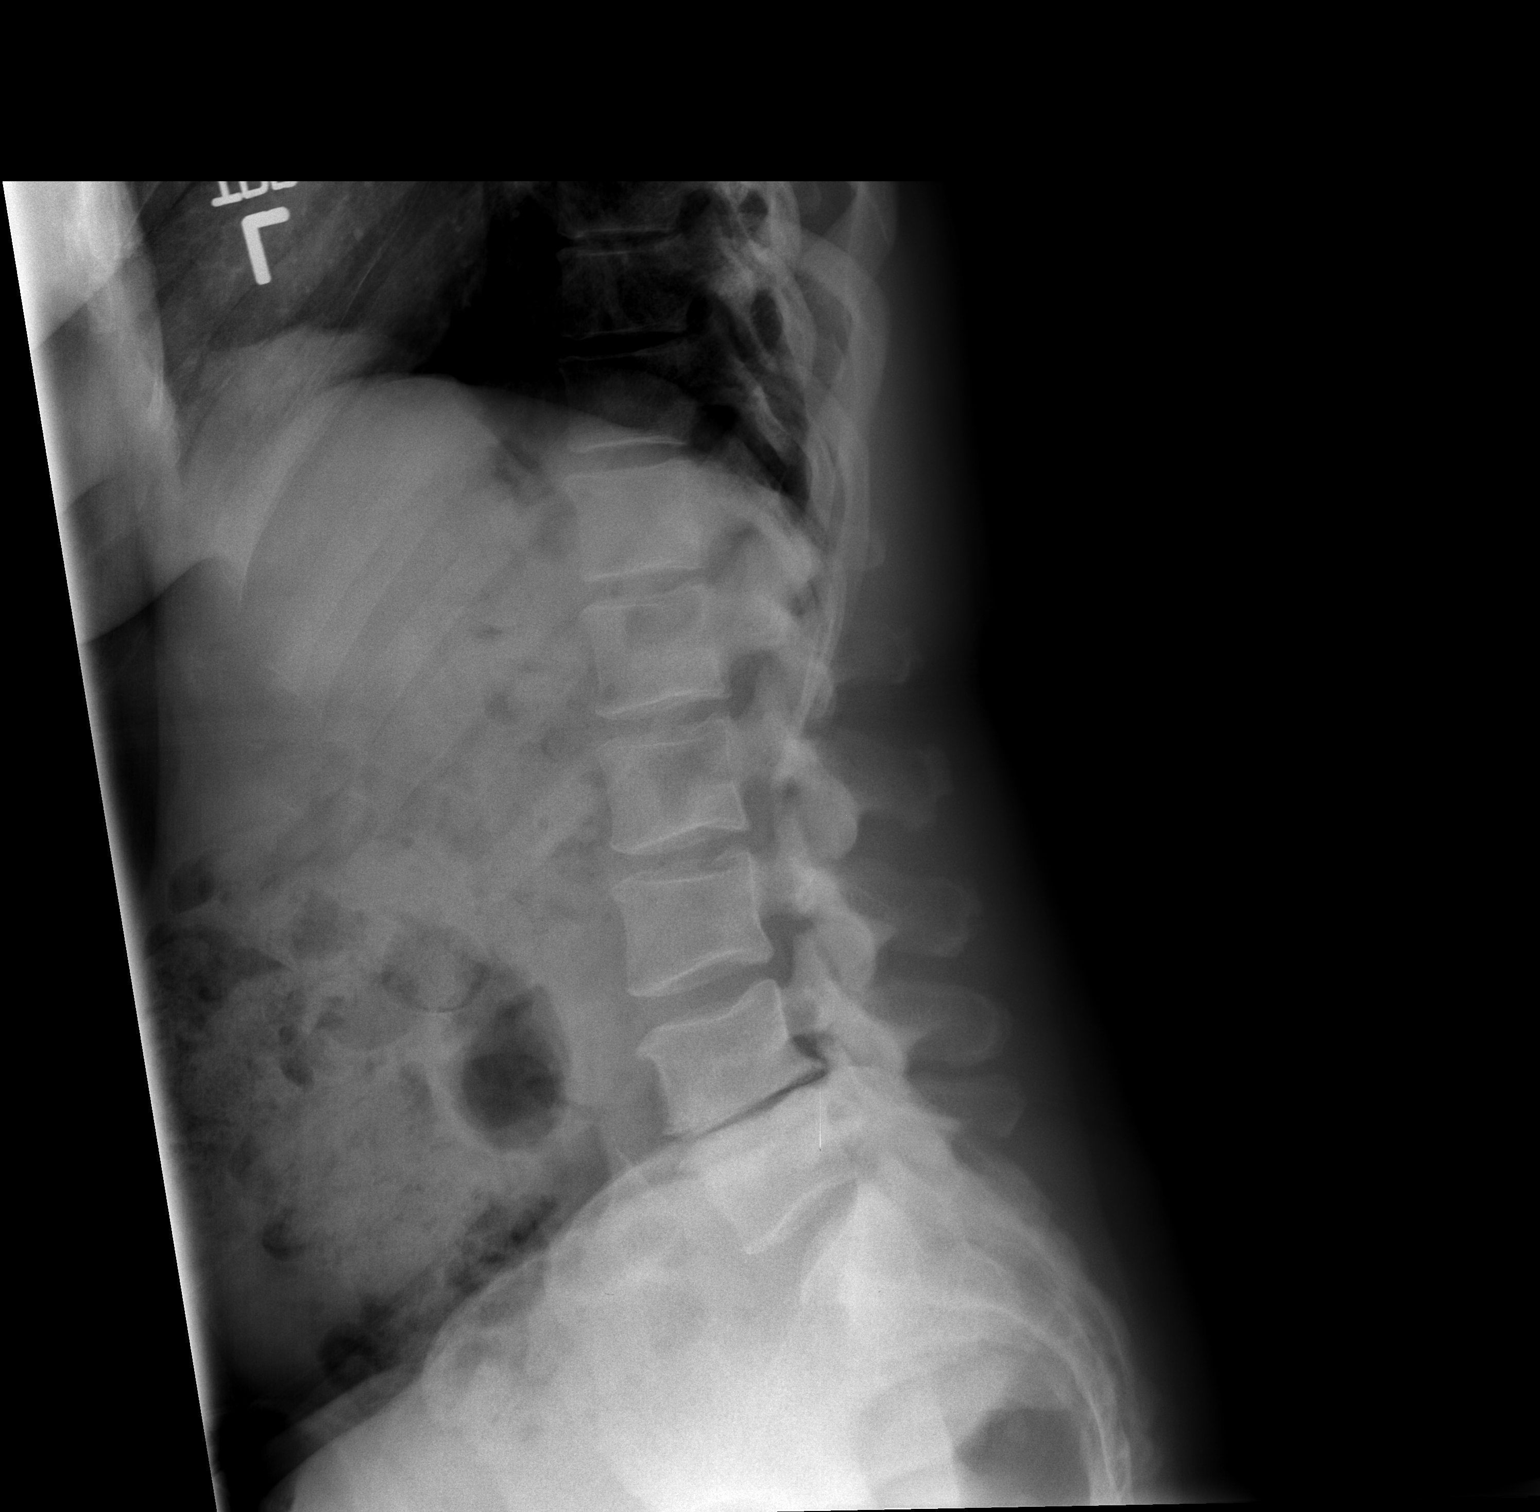

[t l-spine l5-s1 spot]
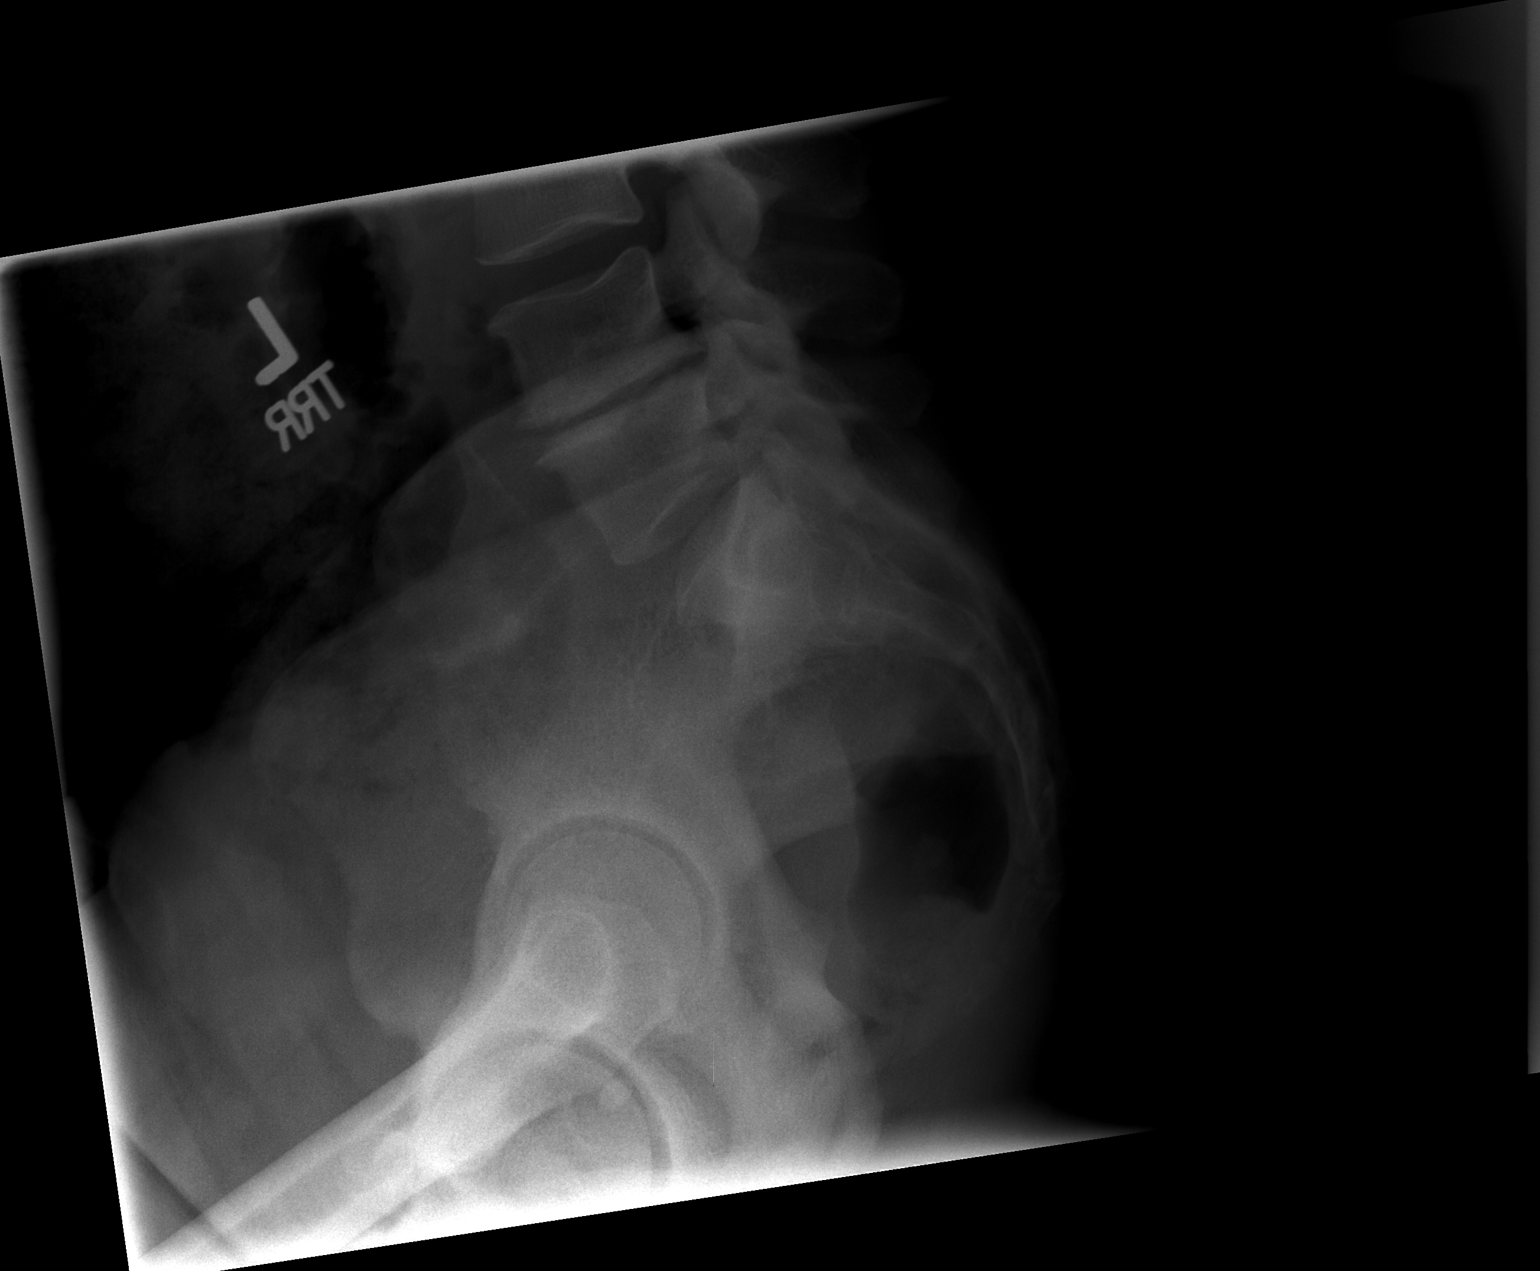

[3 of 3 positions shown; findings below may reference images not displayed]

FINDINGS: 5 lumbar vertebrae.

Trace L3-L4 grade 1 retrolisthesis.

Vertebral body height is maintained.

Multilevel disc space narrowing. Most notably, there is advanced
disc space narrowing at L4-L5 with associated endplate sclerosis and
endplate osteophytes. Mild facet arthrosis at L4-L5 and L5-S1.
IMPRESSION: Lumbar spondylosis as described and greatest at L4-L5.

No evidence of lumbar compression fracture.

Trace L3-L4 grade 1 retrolisthesis.

## 2023-01-02 ENCOUNTER — Ambulatory Visit (HOSPITAL_COMMUNITY)
Admission: EM | Admit: 2023-01-02 | Discharge: 2023-01-02 | Disposition: A | Discharge status: Home / Self Care | Payer: 59 | Attending: Internal Medicine | Admitting: Internal Medicine

## 2023-01-02 ENCOUNTER — Encounter (HOSPITAL_COMMUNITY): Payer: Self-pay

## 2023-01-02 DIAGNOSIS — J069 Acute upper respiratory infection, unspecified: Secondary | ICD-10-CM | POA: Diagnosis not present

## 2023-01-02 DIAGNOSIS — Z20822 Contact with and (suspected) exposure to covid-19: Secondary | ICD-10-CM | POA: Diagnosis not present

## 2023-01-02 DIAGNOSIS — R509 Fever, unspecified: Secondary | ICD-10-CM | POA: Insufficient documentation

## 2023-01-02 DIAGNOSIS — R112 Nausea with vomiting, unspecified: Secondary | ICD-10-CM

## 2023-01-02 LAB — POCT RAPID STREP A (OFFICE): Rapid Strep A Screen: NEGATIVE

## 2023-01-02 MED ORDER — ONDANSETRON 4 MG PO TBDP
ORAL_TABLET | ORAL | Status: AC
  Filled 2023-01-02: qty 2

## 2023-01-02 MED ORDER — ACETAMINOPHEN 325 MG PO TABS
325.0000 mg | ORAL_TABLET | Freq: Once | ORAL | Status: AC
  Administered 2023-01-02: 325 mg via ORAL

## 2023-01-02 MED ORDER — BENZONATATE 100 MG PO CAPS: 200.0000 mg | ORAL_CAPSULE | Freq: Three times a day (TID) | ORAL | 0 refills | Status: DC | PRN

## 2023-01-02 MED ORDER — ACETAMINOPHEN 325 MG PO TABS
ORAL_TABLET | ORAL | Status: AC
  Filled 2023-01-02: qty 1

## 2023-01-02 MED ORDER — ONDANSETRON 4 MG PO TBDP
8.0000 mg | ORAL_TABLET | Freq: Once | ORAL | Status: AC
  Administered 2023-01-02: 8 mg via ORAL

## 2023-01-02 MED ORDER — ACETAMINOPHEN 325 MG PO TABS
ORAL_TABLET | ORAL | Status: AC
  Filled 2023-01-02: qty 2

## 2023-01-02 MED ORDER — ACETAMINOPHEN 325 MG PO TABS
650.0000 mg | ORAL_TABLET | Freq: Once | ORAL | Status: AC
  Administered 2023-01-02: 650 mg via ORAL

## 2023-01-02 MED ORDER — ONDANSETRON 8 MG PO TBDP: 8.0000 mg | ORAL_TABLET | Freq: Three times a day (TID) | ORAL | 0 refills | Status: DC | PRN

## 2023-01-02 NOTE — ED Triage Notes (Signed)
Weakness; Diarrhea; Vomiting; productive Cough; Runny Nose (1Day). No known sick exposure, no one with similar symptoms. No medication or dietary changes. Patient recently traveled to [place].    Has not tried any otc medications for symptoms.

## 2023-01-02 NOTE — ED Provider Notes (Signed)
[facility]    CSN: [id number] Arrival date & time: 01/02/23  1016      History   Chief Complaint Chief Complaint  Patient presents with   Emesis   Fatigue   Cough   Sore Throat    HPI Belinda Casey is a 52 y.o. female.   HPI Patient here today with a history of 1 day persistent vomiting, generalized fatigue, generalized bodyaches, cough which is productive and sore throat.  On arrival patient has a fever of 100.9.  Denies any known sick contacts.  Has some nasal congestion with drainage.  He has not taken any medication today for symptoms.  She was unaware that she was febrile but endorses only feeling unwell.  Had any recent travel.  She denies any wheezing or shortness of breath.  Patient is an occasional smoker. History reviewed. No pertinent past medical history.  There are no problems to display for this patient.   History reviewed. No pertinent surgical history.  OB History   No obstetric history on file.      Home Medications    Prior to Admission medications   Medication Sig Start Date End Date Taking? Authorizing Provider  benzonatate (TESSALON) 100 MG capsule Take 2 capsules (200 mg total) by mouth 3 (three) times daily as needed for cough. 01/02/23  Yes Bing Neighbors, NP  Multiple Vitamin (MULTIVITAMIN WITH MINERALS) TABS tablet Take 1 tablet by mouth daily.   Yes [provider]  ondansetron (ZOFRAN-ODT) 8 MG disintegrating tablet Take 1 tablet (8 mg total) by mouth every 8 (eight) hours as needed for nausea. 01/02/23  Yes Bing Neighbors, NP  fluticasone (FLONASE) 50 MCG/ACT nasal spray Place 2 sprays into both nostrils daily. 05/04/21   Rushie Chestnut, PA-C  ibuprofen (ADVIL,MOTRIN) 800 MG tablet Take 1 tablet (800 mg total) by mouth every 8 (eight) hours as needed for mild pain or moderate pain. 08/07/15   Trixie Dredge, PA-C    Family History Family History  Problem Relation Age of Onset   Cancer Father    Diabetes  Mother    Hypertension Mother     Social History Social History   Tobacco Use   Smoking status: Some Days    Types: Cigarettes  Vaping Use   Vaping status: Never Used  Substance Use Topics   Alcohol use: Not Currently   Drug use: Never     Allergies   Patient has no known allergies.   Review of Systems Review of Systems Pertinent negatives listed in HPI  Physical Exam Triage Vital Signs ED Triage Vitals  Encounter Vitals Group     BP 01/02/23 1118 131/84     Systolic BP Percentile --      Diastolic BP Percentile --      Pulse Rate 01/02/23 1118 (!) 111     Resp 01/02/23 1118 16     Temp 01/02/23 1118 (!) 100.9 F (38.3 C)     Temp Source 01/02/23 1118 Oral     SpO2 01/02/23 1118 96 %     Weight 01/02/23 1118 130 lb (59 kg)     Height 01/02/23 1118 5\' 1"  (1.549 m)     Head Circumference --      Peak Flow --      Pain Score 01/02/23 1117 8     Pain Loc --      Pain Education --      Exclude from Growth Chart --    No  data found.  Updated Vital Signs BP 131/84 (BP Location: Right Arm)   Pulse (!) 111   Temp (!) 100.9 F (38.3 C) (Oral)   Resp 16   Ht 5\' 1"  (1.549 m)   Wt 130 lb (59 kg)   LMP 12/18/2022 (Approximate)   SpO2 96%   BMI 24.56 kg/m   Visual Acuity Right Eye Distance:   Left Eye Distance:   Bilateral Distance:    Right Eye Near:   Left Eye Near:    Bilateral Near:     Physical Exam Constitutional:      Appearance: She is ill-appearing.  HENT:     Head: Normocephalic and atraumatic.     Nose: Congestion and rhinorrhea present.     Mouth/Throat:     Pharynx: Posterior oropharyngeal erythema present. No oropharyngeal exudate or uvula swelling.     Tonsils: No tonsillar exudate or tonsillar abscesses.  Eyes:     Conjunctiva/sclera: Conjunctivae normal.     Pupils: Pupils are equal, round, and reactive to light.  Neck:     Thyroid: No thyromegaly.  Cardiovascular:     Rate and Rhythm: Regular rhythm. Tachycardia present.   Pulmonary:     Effort: Pulmonary effort is normal.     Breath sounds: Normal breath sounds.  Musculoskeletal:     Cervical back: Normal range of motion.  Lymphadenopathy:     Cervical: Cervical adenopathy present.  Skin:    General: Skin is warm.  Neurological:     General: No focal deficit present.     Mental Status: She is alert.  Psychiatric:        Mood and Affect: Mood normal.      UC Treatments / Results  Labs (all labs ordered are listed, but only abnormal results are displayed) Labs Reviewed  SARS CORONAVIRUS 2 (TAT 6-24 HRS)  POCT RAPID STREP A (OFFICE)    EKG   Radiology No results found.  Procedures Procedures (including critical care time)  Medications Ordered in UC Medications  acetaminophen (TYLENOL) tablet 650 mg (650 mg Oral Given 01/02/23 1124)  ondansetron (ZOFRAN-ODT) disintegrating tablet 8 mg (8 mg Oral Given 01/02/23 1207)  acetaminophen (TYLENOL) tablet 325 mg (325 mg Oral Given 01/02/23 1204)    Initial Impression / Assessment and Plan / UC Course  I have reviewed the triage vital signs and the nursing notes.  Pertinent labs & imaging results that were available during my care of the patient were reviewed by me and considered in my medical decision making (see chart for details).    Zofran for nausea and acetaminophen for fever were given orally here in clinic. COVID-19 test pending, patient advised to continue Tylenol or ibuprofen for management of fever and generalized bodyaches.  Prescribed benzonatate Perles for cough and Zofran for nausea.  Patient encouraged to continue to rest.  ER precautions given if any red flag symptoms develop.  Patient verbalized understanding and agreement with plan. Final Clinical Impressions(s) / UC Diagnoses   Final diagnoses:  Encounter for laboratory testing for COVID-19 virus  Febrile illness  Acute upper respiratory infection  Nausea and vomiting, unspecified vomiting type     Discharge  Instructions      COVID pending.  Our office will call you if your COVID test is positive. Manage symptoms only. Manage fever with Tylenol and ibuprofen.  Nasal symptoms with over-the-counter antihistamines recommended.  If you develop any severe difficulty breathing, vomiting that will not resolve with Zofran, or severe chest pain go  immediately to the emergency department.      ED Prescriptions     Medication Sig Dispense Auth. Provider   ondansetron (ZOFRAN-ODT) 8 MG disintegrating tablet Take 1 tablet (8 mg total) by mouth every 8 (eight) hours as needed for nausea. 20 tablet Bing Neighbors, NP   benzonatate (TESSALON) 100 MG capsule Take 2 capsules (200 mg total) by mouth 3 (three) times daily as needed for cough. 30 capsule Bing Neighbors, NP      PDMP not reviewed this encounter.   Bing Neighbors, NP 01/02/23 1220

## 2023-01-02 NOTE — Discharge Instructions (Addendum)
COVID pending.  Our office will call you if your COVID test is positive. Manage symptoms only. Manage fever with Tylenol and ibuprofen.  Nasal symptoms with over-the-counter antihistamines recommended.  If you develop any severe difficulty breathing, vomiting that will not resolve with Zofran, or severe chest pain go immediately to the emergency department.

## 2023-01-03 LAB — SARS CORONAVIRUS 2 (TAT 6-24 HRS): SARS Coronavirus 2: POSITIVE — AB

## 2023-06-19 ENCOUNTER — Ambulatory Visit (HOSPITAL_COMMUNITY)
Admission: EM | Admit: 2023-06-19 | Discharge: 2023-06-19 | Disposition: A | Discharge status: Home / Self Care | Payer: 59 | Attending: Physician Assistant | Admitting: Physician Assistant

## 2023-06-19 ENCOUNTER — Encounter (HOSPITAL_COMMUNITY): Payer: Self-pay | Admitting: *Deleted

## 2023-06-19 DIAGNOSIS — J208 Acute bronchitis due to other specified organisms: Secondary | ICD-10-CM

## 2023-06-19 DIAGNOSIS — R051 Acute cough: Secondary | ICD-10-CM

## 2023-06-19 DIAGNOSIS — R490 Dysphonia: Secondary | ICD-10-CM

## 2023-06-19 MED ORDER — AEROCHAMBER PLUS FLO-VU LARGE MISC
1.0000 | Freq: Once | Status: AC
  Administered 2023-06-19: 1

## 2023-06-19 MED ORDER — ALBUTEROL SULFATE HFA 108 (90 BASE) MCG/ACT IN AERS
INHALATION_SPRAY | RESPIRATORY_TRACT | Status: AC
  Filled 2023-06-19: qty 6.7

## 2023-06-19 MED ORDER — PROMETHAZINE-DM 6.25-15 MG/5ML PO SYRP: 5.0000 mL | ORAL_SOLUTION | Freq: Three times a day (TID) | ORAL | 0 refills | Status: AC | PRN

## 2023-06-19 MED ORDER — ALBUTEROL SULFATE HFA 108 (90 BASE) MCG/ACT IN AERS
2.0000 | INHALATION_SPRAY | Freq: Once | RESPIRATORY_TRACT | Status: AC
  Administered 2023-06-19: 2 via RESPIRATORY_TRACT

## 2023-06-19 MED ORDER — METHYLPREDNISOLONE 4 MG PO TBPK: ORAL_TABLET | ORAL | 0 refills | Status: AC

## 2023-06-19 MED ORDER — AEROCHAMBER PLUS FLO-VU LARGE MISC
Status: AC
  Filled 2023-06-19: qty 1

## 2023-06-19 NOTE — ED Triage Notes (Signed)
 Pt states she has sore throat, cough, congestion and hoarse since Friday. She is taking mucinex without relief.

## 2023-06-19 NOTE — Discharge Instructions (Signed)
 As we discussed, I am concerned that you have a viral bronchitis.  Use albuterol  for 4 to 6 hours as needed for shortness of breath and coughing fits.  Start Medrol  Dosepak.  Avoid NSAIDs including aspirin, ibuprofen /Advil , naproxen /Aleve .  If you develop any side effects with medication to be seen immediately.  Take Promethazine  DM for cough.  This will make you sleepy so do not drive or drink alcohol while taking it.  Use over-the-counter medications including Mucinex, Flonase , Tylenol  for additional symptom relief.  Make sure that you rest and drink plenty of fluid.  If your symptoms are not improving by next week or if anything worsens and you have worsening cough, shortness of breath, high fever, nausea, vomiting you need to be seen immediately.

## 2023-06-19 NOTE — ED Provider Notes (Signed)
 [facility]    CSN: [id number] Arrival date & time: 06/19/23  1454      History   Chief Complaint Chief Complaint  Patient presents with   Sore Throat   Cough   Nasal Congestion   Hoarse    HPI Belinda Casey is a 53 y.o. female.   Patient presents today with a weeklong history of URI symptoms.  Reports sore throat, cough, congestion, hoarseness.  Denies any fever, chest pain, shortness of breath, nausea, vomiting, diarrhea.  She denies any known sick contacts.  She has tried Mucinex and over-the-counter cough and cold medication without improvement of symptoms.  She denies any recent antibiotics or steroids.  Denies history of asthma, COPD, allergies.  She does smoke.  She has no concern for pregnancy.  She is having difficulty with her daily activities as a result of symptoms.  She is eating and drinking normally.    History reviewed. No pertinent past medical history.  There are no active problems to display for this patient.   History reviewed. No pertinent surgical history.  OB History   No obstetric history on file.      Home Medications    Prior to Admission medications   Medication Sig Start Date End Date Taking? Authorizing Provider  methylPREDNISolone  (MEDROL  DOSEPAK) 4 MG TBPK tablet As directed 06/19/23  Yes Ayaat Jansma K, PA-C  promethazine -dextromethorphan (PROMETHAZINE -DM) 6.25-15 MG/5ML syrup Take 5 mLs by mouth 3 (three) times daily as needed for cough. 06/19/23  Yes Emrey Thornley, Rocky POUR, PA-C    Family History Family History  Problem Relation Age of Onset   Cancer Father    Diabetes Mother    Hypertension Mother     Social History Social History   Tobacco Use   Smoking status: Some Days    Types: Cigarettes  Vaping Use   Vaping status: Never Used  Substance Use Topics   Alcohol use: Not Currently   Drug use: Never     Allergies   Patient has no known allergies.   Review of Systems Review of Systems  Constitutional:   Positive for activity change. Negative for appetite change, fatigue and fever.  HENT:  Positive for congestion, postnasal drip, sore throat and voice change. Negative for sinus pressure, sneezing and trouble swallowing.   Respiratory:  Positive for cough and chest tightness. Negative for shortness of breath and wheezing.   Cardiovascular:  Negative for chest pain.  Gastrointestinal:  Negative for abdominal pain, diarrhea, nausea and vomiting.  Musculoskeletal:  Positive for arthralgias and myalgias.  Neurological:  Positive for headaches. Negative for dizziness and light-headedness.     Physical Exam Triage Vital Signs ED Triage Vitals  Encounter Vitals Group     BP 06/19/23 1720 (!) 142/99     Systolic BP Percentile --      Diastolic BP Percentile --      Pulse Rate 06/19/23 1720 80     Resp 06/19/23 1720 20     Temp 06/19/23 1720 98.4 F (36.9 C)     Temp Source 06/19/23 1720 Oral     SpO2 06/19/23 1720 100 %     Weight --      Height --      Head Circumference --      Peak Flow --      Pain Score 06/19/23 1719 8     Pain Loc --      Pain Education --      Exclude from Growth Chart --  No data found.  Updated Vital Signs BP (!) 142/99 (BP Location: Right Arm)   Pulse 80   Temp 98.4 F (36.9 C) (Oral)   Resp 20   LMP 06/12/2023 (Approximate)   SpO2 100%   Visual Acuity Right Eye Distance:   Left Eye Distance:   Bilateral Distance:    Right Eye Near:   Left Eye Near:    Bilateral Near:     Physical Exam Vitals reviewed.  Constitutional:      General: She is awake. She is not in acute distress.    Appearance: Normal appearance. She is well-developed. She is not ill-appearing.     Comments: Very pleasant female appears stated age in no acute distress sitting comfortably in exam room  HENT:     Head: Normocephalic and atraumatic.     Right Ear: Tympanic membrane, ear canal and external ear normal. Tympanic membrane is not erythematous or bulging.     Left  Ear: Tympanic membrane, ear canal and external ear normal. Tympanic membrane is not erythematous or bulging.     Nose:     Right Sinus: No maxillary sinus tenderness or frontal sinus tenderness.     Left Sinus: No maxillary sinus tenderness or frontal sinus tenderness.     Mouth/Throat:     Pharynx: Uvula midline. Postnasal drip present. No oropharyngeal exudate or posterior oropharyngeal erythema.  Cardiovascular:     Rate and Rhythm: Normal rate and regular rhythm.     Heart sounds: Normal heart sounds, S1 normal and S2 normal. No murmur heard. Pulmonary:     Effort: Pulmonary effort is normal.     Breath sounds: Examination of the right-lower field reveals decreased breath sounds. Examination of the left-lower field reveals decreased breath sounds. Decreased breath sounds present. No wheezing, rhonchi or rales.  Psychiatric:        Behavior: Behavior is cooperative.      UC Treatments / Results  Labs (all labs ordered are listed, but only abnormal results are displayed) Labs Reviewed - No data to display  EKG   Radiology No results found.  Procedures Procedures (including critical care time)  Medications Ordered in UC Medications  albuterol  (VENTOLIN  HFA) 108 (90 Base) MCG/ACT inhaler 2 puff (2 puffs Inhalation Given 06/19/23 1750)  AeroChamber Plus Flo-Vu Large MISC 1 each (1 each Other Given 06/19/23 1750)    Initial Impression / Assessment and Plan / UC Course  I have reviewed the triage vital signs and the nursing notes.  Pertinent labs & imaging results that were available during my care of the patient were reviewed by me and considered in my medical decision making (see chart for details).     Patient is well-appearing, afebrile, nontoxic, nontachycardic.  She did have decreased aeration of bilateral bases but this improved following dose of albuterol  in clinic today.  Viral testing was deferred as she has been symptomatic for more than 5 days and this would not  change management.  No evidence of acute infection on physical exam that warrant initiation of antibiotics.  Chest x-ray was deferred as her lungs were clear after a dose of albuterol  and she had oxygen saturation of 100%.  Concern for viral bronchitis given clinical presentation.  She was sent home with albuterol  to be used every 4-6 hours as needed.  Will start methylprednisolone  as she believes that she has had an intolerance to prednisone  in the past.  We discussed that if she has any side effects or concerns she  should stop the medication to be reevaluated.  She can use over-the-counter medication for additional symptom relief including Mucinex, Flonase , Tylenol .  She was given Promethazine  DM for cough.  We discussed that this can be sedating and she is not to drive or drink alcohol while taking it.  Recommend that she rest and drink plenty of fluid.  If her symptoms are not improving or if anything worsens she is to be reevaluated.  Strict return precautions given.  Excuse note provided.  Final Clinical Impressions(s) / UC Diagnoses   Final diagnoses:  Viral bronchitis  Acute cough  Hoarseness of voice     Discharge Instructions      As we discussed, I am concerned that you have a viral bronchitis.  Use albuterol  for 4 to 6 hours as needed for shortness of breath and coughing fits.  Start Medrol  Dosepak.  Avoid NSAIDs including aspirin, ibuprofen /Advil , naproxen /Aleve .  If you develop any side effects with medication to be seen immediately.  Take Promethazine  DM for cough.  This will make you sleepy so do not drive or drink alcohol while taking it.  Use over-the-counter medications including Mucinex, Flonase , Tylenol  for additional symptom relief.  Make sure that you rest and drink plenty of fluid.  If your symptoms are not improving by next week or if anything worsens and you have worsening cough, shortness of breath, high fever, nausea, vomiting you need to be seen immediately.     ED  Prescriptions     Medication Sig Dispense Auth. Provider   methylPREDNISolone  (MEDROL  DOSEPAK) 4 MG TBPK tablet As directed 21 tablet Cass Edinger K, PA-C   promethazine -dextromethorphan (PROMETHAZINE -DM) 6.25-15 MG/5ML syrup Take 5 mLs by mouth 3 (three) times daily as needed for cough. 118 mL Knolan Simien K, PA-C      PDMP not reviewed this encounter.   Sherrell Rocky POUR, PA-C 06/19/23 1832
